# Patient Record
Sex: Male | Born: 1980
Health system: Southern US, Community
[De-identification: ages and names within clinical notes are randomized; demographics above are authoritative.]

---

## 2018-05-23 DIAGNOSIS — R0789 Other chest pain: Secondary | ICD-10-CM | POA: Diagnosis not present

## 2018-05-23 DIAGNOSIS — M5412 Radiculopathy, cervical region: Secondary | ICD-10-CM | POA: Diagnosis not present

## 2019-11-19 DIAGNOSIS — Z20822 Contact with and (suspected) exposure to covid-19: Secondary | ICD-10-CM | POA: Diagnosis not present

## 2019-11-20 DIAGNOSIS — R509 Fever, unspecified: Secondary | ICD-10-CM | POA: Diagnosis not present

## 2019-11-20 DIAGNOSIS — J189 Pneumonia, unspecified organism: Secondary | ICD-10-CM | POA: Diagnosis not present

## 2019-11-20 DIAGNOSIS — R05 Cough: Secondary | ICD-10-CM | POA: Diagnosis not present

## 2019-11-20 DIAGNOSIS — R079 Chest pain, unspecified: Secondary | ICD-10-CM | POA: Diagnosis not present

## 2019-11-20 DIAGNOSIS — R0781 Pleurodynia: Secondary | ICD-10-CM | POA: Diagnosis not present

## 2019-11-22 ENCOUNTER — Other Ambulatory Visit: Payer: Self-pay

## 2019-11-22 ENCOUNTER — Encounter (HOSPITAL_BASED_OUTPATIENT_CLINIC_OR_DEPARTMENT_OTHER): Payer: Self-pay

## 2019-11-22 ENCOUNTER — Emergency Department (HOSPITAL_BASED_OUTPATIENT_CLINIC_OR_DEPARTMENT_OTHER): Payer: BC Managed Care – PPO

## 2019-11-22 ENCOUNTER — Emergency Department (HOSPITAL_BASED_OUTPATIENT_CLINIC_OR_DEPARTMENT_OTHER)
Admission: EM | Admit: 2019-11-22 | Discharge: 2019-11-22 | Disposition: A | Discharge status: Home / Self Care | Payer: BC Managed Care – PPO | Attending: Emergency Medicine | Admitting: Emergency Medicine

## 2019-11-22 DIAGNOSIS — J069 Acute upper respiratory infection, unspecified: Secondary | ICD-10-CM | POA: Diagnosis not present

## 2019-11-22 DIAGNOSIS — B9789 Other viral agents as the cause of diseases classified elsewhere: Secondary | ICD-10-CM | POA: Diagnosis not present

## 2019-11-22 DIAGNOSIS — J189 Pneumonia, unspecified organism: Secondary | ICD-10-CM | POA: Diagnosis not present

## 2019-11-22 DIAGNOSIS — Z20822 Contact with and (suspected) exposure to covid-19: Secondary | ICD-10-CM | POA: Diagnosis not present

## 2019-11-22 DIAGNOSIS — R05 Cough: Secondary | ICD-10-CM | POA: Diagnosis not present

## 2019-11-22 MED ORDER — BENZONATATE 100 MG PO CAPS: 100.0000 mg | ORAL_CAPSULE | Freq: Three times a day (TID) | ORAL | 0 refills | Status: AC | PRN

## 2019-11-22 NOTE — Discharge Instructions (Addendum)
I am prescribing a medication called Tessalon Perles which she can take for her cough.  You can take these up to 3 times a day as needed for your cough.  If you find that this is not providing relief, you can always try Delsym which is an over-the-counter medication.  I would continue taking your prescribed antibiotics for the rest of the week.  I would also recommend you continue taking Tylenol ibuprofen for management of your body aches and fever.  Consider NyQuil at night if you are also having difficulty sleeping.  This medication has acetaminophen in it so please do not take additional Tylenol in addition to your NyQuil.  You can always return to the emergency department with new or worsening symptoms.  It was a pleasure to meet you.

## 2019-11-22 NOTE — ED Triage Notes (Signed)
Pt states he was dx with PNA 2 days ago-had neg covid-taking abx x 2-states feels no better-NAD-steady gait

## 2019-11-22 NOTE — ED Provider Notes (Signed)
[facility] EMERGENCY DEPARTMENT Provider Note   CSN: [id number] Arrival date & time: 11/22/19  1147     History Chief Complaint  Patient presents with  . Pneumonia    Cory Oconnell is a 39 y.o. male.  HPI   Patient is a 39 year old male with no pertinent medical history.  Patient states about 5 days ago he began experiencing cough, left-sided chest pain when coughing, body aches, intermittent fevers, chills, intermittent diffuse headache.  He was seen in urgent care 3 days ago and diagnosed with a viral URI.  He then returned to urgent care 2 days ago and was told that he had pneumonia in his left lower lobe and was discharged on 7 days of doxycycline as well as amoxicillin.  He is currently on day 2 of 7.  He is taking 1000 mg of amoxicillin 3 times daily as well as 100 mg of doxycycline twice daily.  He is also taking Mucinex.  He notes minimal relief with all 3 medications.  He has been taking Tylenol for his fevers with his last dose about 4 hours ago.  He is currently afebrile at this time.  He has not been immunized for COVID-19.  No rhinorrhea, congestion, sore throat, no loss taste or smell, dysuria, syncope.      History reviewed. No pertinent past medical history.  There are no problems to display for this patient.   History reviewed. No pertinent surgical history.     No family history on file.  Social History   Tobacco Use  . Smoking status: Never Smoker  . Smokeless tobacco: Never Used  Vaping Use  . Vaping Use: Never used  Substance Use Topics  . Alcohol use: Never  . Drug use: Never    Home Medications Prior to Admission medications   Not on File    Allergies    Patient has no known allergies.  Review of Systems   Review of Systems  All other systems reviewed and are negative. Ten systems reviewed and are negative for acute change, except as noted in the HPI.   Physical Exam Updated Vital Signs BP (!) 118/91 (BP Location: Left Arm)    Pulse 96   Temp 98.8 F (37.1 C) (Oral)   Resp 20   Ht 6\' 3"  (1.905 m)   Wt 102.1 kg   SpO2 95%   BMI 28.12 kg/m   Physical Exam Vitals and nursing note reviewed.  Constitutional:      General: He is not in acute distress.    Appearance: Normal appearance. He is not ill-appearing, toxic-appearing or diaphoretic.     Comments: Well-developed adult male lying in the semi-Fowlers position.  He is speaking clearly, coherently, in complete sentences.  HENT:     Head: Normocephalic and atraumatic.     Right Ear: External ear normal.     Left Ear: External ear normal.     Nose: Nose normal. No congestion or rhinorrhea.     Mouth/Throat:     Mouth: Mucous membranes are moist.     Pharynx: Oropharynx is clear. No oropharyngeal exudate or posterior oropharyngeal erythema.  Eyes:     General: No scleral icterus.       Right eye: No discharge.        Left eye: No discharge.     Extraocular Movements: Extraocular movements intact.     Conjunctiva/sclera: Conjunctivae normal.     Pupils: Pupils are equal, round, and reactive to light.  Cardiovascular:  Rate and Rhythm: Normal rate and regular rhythm.     Pulses: Normal pulses.     Heart sounds: Normal heart sounds. No murmur heard.  No friction rub. No gallop.      Comments: Regular rate and rhythm.  Patient is not tachycardic on my exam. Pulmonary:     Effort: Pulmonary effort is normal. No respiratory distress.     Breath sounds: Normal breath sounds. No stridor. No wheezing, rhonchi or rales.     Comments: Lungs are clear to auscultation bilaterally. Abdominal:     General: Abdomen is flat.     Palpations: Abdomen is soft.     Tenderness: There is no abdominal tenderness.  Musculoskeletal:        General: Normal range of motion.     Cervical back: Normal range of motion and neck supple. No tenderness.  Skin:    General: Skin is warm and dry.  Neurological:     General: No focal deficit present.     Mental Status: He is  alert and oriented to person, place, and time.  Psychiatric:        Mood and Affect: Mood normal.        Behavior: Behavior normal.    ED Results / Procedures / Treatments   Labs (all labs ordered are listed, but only abnormal results are displayed) Labs Reviewed - No data to display  EKG None  Radiology DG Chest Portable 1 View  Result Date: 11/22/2019 CLINICAL DATA:  Cough EXAM: PORTABLE CHEST 1 VIEW COMPARISON:  None. FINDINGS: Lungs are clear. Heart size and pulmonary vascularity are normal. No adenopathy. No bone lesions. IMPRESSION: Lungs clear.  Cardiac silhouette normal. Electronically Signed   By: Bretta Bang III M.D.   On: 11/22/2019 14:17   Procedures Procedures   Medications Ordered in ED Medications - No data to display  ED Course  I have reviewed the triage vital signs and the nursing notes.  Pertinent labs & imaging results that were available during my care of the patient were reviewed by me and considered in my medical decision making (see chart for details).  Clinical Course as of Nov 21 1457  Wed Nov 22, 2019  1344 Afebrile. Last dose of APAP 4 hours ago.   Temp: 98.8 F (37.1 C) [LJ]  1359 Patient had basic labs obtained 2 days ago at urgent care.  These were benign.  We will repeat his chest x-ray as well as COVID-19 test.   [LJ]  1447 No acute abnormalities  DG Chest Portable 1 View [LJ]    Clinical Course User Index [LJ] Placido Sou, PA-C   MDM Rules/Calculators/A&P                          Pt is a 39 y.o. male that present with a history, physical exam, ED Clinical Course as noted above.   Patient presents today with what appears to be a viral URI.  He was diagnosed with pneumonia 2 days ago at urgent care and has been taking amoxicillin as well as doxycycline.  I repeated his chest x-ray today which was benign.  Patient had a negative COVID-19 test 2 days ago and declined a repeat COVID-19 test today.  I discussed these results with  the patient in detail.  He is afebrile today.  He is not tachycardic.  His lungs are clear to auscultation bilaterally.  He seems to be experiencing a intractable dry cough.  We will prescribe him a short course of Tessalon Perles.  We discussed dosing.  Recommended multiple over-the-counter medications for treatment of his symptoms.  Recommended that he continue with ibuprofen and Tylenol for his fevers.  Additionally recommended Delsym he finds that [employer] not providing relief of his cough.  NyQuil at night.  He understands he can return the emergency department if he develops new or worsening symptoms.  His questions were answered and he was amicable to time of discharge.  His vital signs are stable.  Patient discharged to home/self care.  Condition at discharge: Stable  Note: Portions of this report may have been transcribed using voice recognition software. Every effort was made to ensure accuracy; however, inadvertent computerized transcription errors may be present.   Final Clinical Impression(s) / ED Diagnoses Final diagnoses:  Viral URI with cough    Rx / DC Orders ED Discharge Orders         Ordered    benzonatate (TESSALON) 100 MG capsule  3 times daily PRN     Discontinue  Reprint     11/22/19 1450           Placido Sou, PA-C 11/22/19 1500    Pollyann Savoy, MD 11/22/19 1515

## 2019-11-25 DIAGNOSIS — R05 Cough: Secondary | ICD-10-CM | POA: Diagnosis not present

## 2019-11-25 DIAGNOSIS — R5383 Other fatigue: Secondary | ICD-10-CM | POA: Diagnosis not present

## 2019-11-25 DIAGNOSIS — J189 Pneumonia, unspecified organism: Secondary | ICD-10-CM | POA: Diagnosis not present

## 2019-11-25 DIAGNOSIS — R509 Fever, unspecified: Secondary | ICD-10-CM | POA: Diagnosis not present

## 2019-11-25 DIAGNOSIS — Z20828 Contact with and (suspected) exposure to other viral communicable diseases: Secondary | ICD-10-CM | POA: Diagnosis not present

## 2019-11-28 ENCOUNTER — Telehealth (HOSPITAL_COMMUNITY): Payer: Self-pay | Admitting: Oncology

## 2019-11-28 NOTE — Telephone Encounter (Signed)
I connected by phone with Cory Oconnell at 2:30pm to discuss the potential use of an new treatment for mild to moderate COVID-19 viral infection in non-hospitalized patients.   This patient is a age/sex that meets the FDA criteria for Emergency Use Authorization of casirivimab\imdevimab.  Has a (+) direct SARS-CoV-2 viral test result 1. Has mild or moderate COVID-19  2. Is ? 38 years of age and weighs ? 40 kg 3. Is NOT hospitalized due to COVID-19 4. Is NOT requiring oxygen therapy or requiring an increase in baseline oxygen flow rate due to COVID-19 5. Is within 10 days of symptom onset 6. Has at least one of the high risk factor(s) for progression to severe COVID-19 and/or hospitalization as defined in EUA. ? Specific high risk criteria :None   Symptom onset: 11/16/19   I have spoken and communicated the following to the patient or parent/caregiver:   1. FDA has authorized the emergency use of casirivimab\imdevimab for the treatment of mild to moderate COVID-19 in adults and pediatric patients with positive results of direct SARS-CoV-2 viral testing who are 79 years of age and older weighing at least 40 kg, and who are at high risk for progressing to severe COVID-19 and/or hospitalization.   2. The significant known and potential risks and benefits of casirivimab\imdevimab, and the extent to which such potential risks and benefits are unknown.   3. Information on available alternative treatments and the risks and benefits of those alternatives, including clinical trials.   4. Patients treated with casirivimab\imdevimab should continue to self-isolate and use infection control measures (e.g., wear mask, isolate, social distance, avoid sharing personal items, clean and disinfect "high touch" surfaces, and frequent handwashing) according to CDC guidelines.    5. The patient or parent/caregiver has the option to accept or refuse casirivimab\imdevimab .   Unfortunately, patient is out of his 10-day  window for onset of symptoms.  He does not qualify for Mab infusion at this time.  Patient states symptoms are improving each day and does not feel he needs to be antibody infusion anyway.   Mignon Pine, AGNP-C [phone] (Infusion Center Hotline)

## 2019-11-30 DIAGNOSIS — J1282 Pneumonia due to coronavirus disease 2019: Secondary | ICD-10-CM | POA: Diagnosis not present

## 2019-11-30 DIAGNOSIS — U071 COVID-19: Secondary | ICD-10-CM | POA: Diagnosis not present

## 2020-04-16 DIAGNOSIS — B0089 Other herpesviral infection: Secondary | ICD-10-CM | POA: Diagnosis not present

## 2020-11-28 DIAGNOSIS — Z6829 Body mass index (BMI) 29.0-29.9, adult: Secondary | ICD-10-CM | POA: Diagnosis not present

## 2020-11-28 DIAGNOSIS — Z1331 Encounter for screening for depression: Secondary | ICD-10-CM | POA: Diagnosis not present

## 2020-11-28 DIAGNOSIS — Z0000 Encounter for general adult medical examination without abnormal findings: Secondary | ICD-10-CM | POA: Diagnosis not present

## 2020-11-28 DIAGNOSIS — Z13220 Encounter for screening for lipoid disorders: Secondary | ICD-10-CM | POA: Diagnosis not present

## 2020-11-28 DIAGNOSIS — Z131 Encounter for screening for diabetes mellitus: Secondary | ICD-10-CM | POA: Diagnosis not present

## 2021-07-22 IMAGING — DX DG CHEST 1V PORT
1 series · 1 of 1 positions shown · non-contrast
Comparison: None.

CLINICAL DATA: Cough

EXAM:
PORTABLE CHEST 1 VIEW

[chest ap]
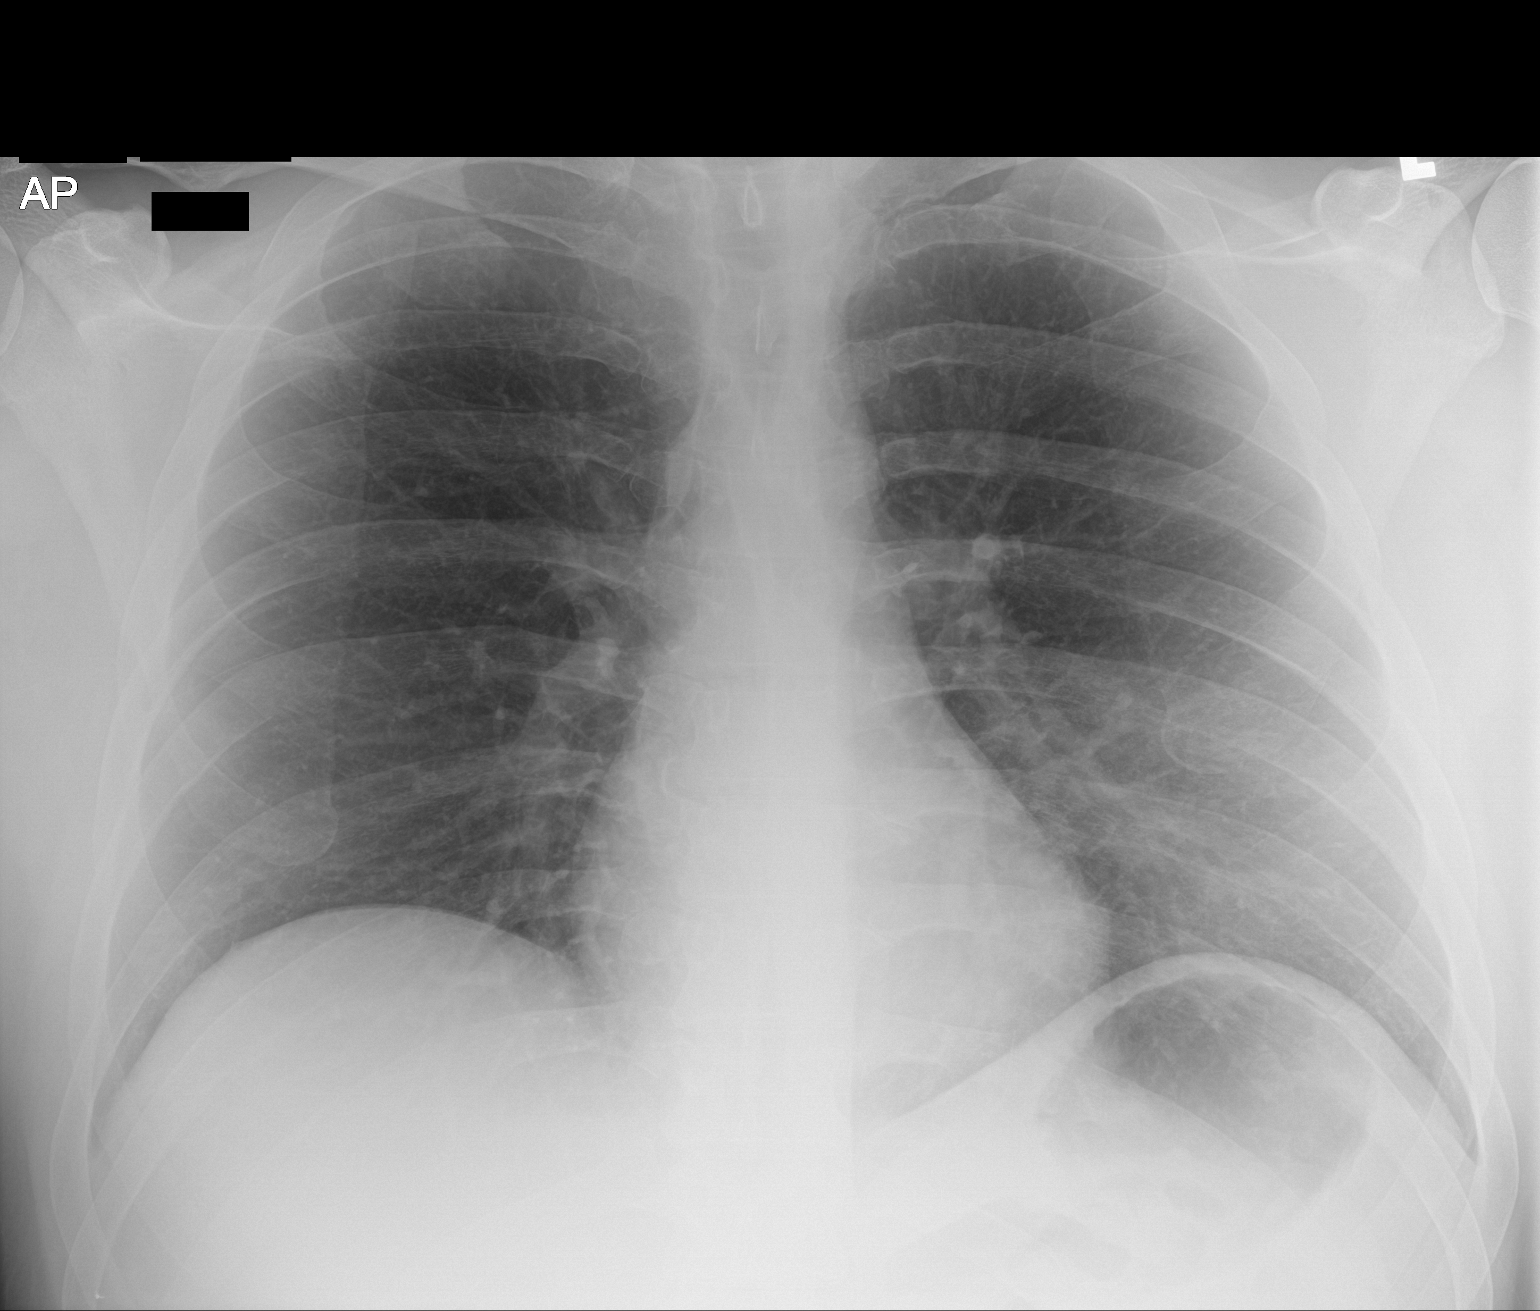

[1 of 1 positions shown; findings below may reference images not displayed]

FINDINGS: Lungs are clear. Heart size and pulmonary vascularity are normal. No
adenopathy. No bone lesions.
IMPRESSION: Lungs clear.  Cardiac silhouette normal.

## 2021-12-01 DIAGNOSIS — B351 Tinea unguium: Secondary | ICD-10-CM | POA: Diagnosis not present

## 2021-12-01 DIAGNOSIS — Z1331 Encounter for screening for depression: Secondary | ICD-10-CM | POA: Diagnosis not present

## 2021-12-01 DIAGNOSIS — Z125 Encounter for screening for malignant neoplasm of prostate: Secondary | ICD-10-CM | POA: Diagnosis not present

## 2021-12-01 DIAGNOSIS — Z0000 Encounter for general adult medical examination without abnormal findings: Secondary | ICD-10-CM | POA: Diagnosis not present

## 2022-12-25 DIAGNOSIS — Z0000 Encounter for general adult medical examination without abnormal findings: Secondary | ICD-10-CM | POA: Diagnosis not present

## 2022-12-25 DIAGNOSIS — Z6829 Body mass index (BMI) 29.0-29.9, adult: Secondary | ICD-10-CM | POA: Diagnosis not present

## 2022-12-25 DIAGNOSIS — Z1331 Encounter for screening for depression: Secondary | ICD-10-CM | POA: Diagnosis not present

## 2022-12-25 DIAGNOSIS — E785 Hyperlipidemia, unspecified: Secondary | ICD-10-CM | POA: Diagnosis not present

## 2022-12-25 DIAGNOSIS — Z125 Encounter for screening for malignant neoplasm of prostate: Secondary | ICD-10-CM | POA: Diagnosis not present
